# Patient Record
Sex: Male | Born: 1970 | Race: White | Hispanic: No | Marital: Married | Smoking: Current every day smoker
Health system: Southern US, Community
[De-identification: ages and names within clinical notes are randomized; demographics above are authoritative.]

## PROBLEM LIST (undated history)

## (undated) DIAGNOSIS — I10 Essential (primary) hypertension: Secondary | ICD-10-CM

---

## 1998-09-02 ENCOUNTER — Emergency Department (HOSPITAL_COMMUNITY): Admission: EM | Admit: 1998-09-02 | Discharge: 1998-09-02 | Payer: Self-pay | Admitting: Internal Medicine

## 1998-09-02 ENCOUNTER — Encounter: Admission: RE | Admit: 1998-09-02 | Discharge: 1998-09-02 | Payer: Self-pay | Admitting: *Deleted

## 1998-09-03 ENCOUNTER — Encounter: Admission: RE | Admit: 1998-09-03 | Discharge: 1998-12-02 | Payer: Self-pay | Admitting: *Deleted

## 2000-07-11 ENCOUNTER — Encounter: Payer: Self-pay | Admitting: Emergency Medicine

## 2000-07-11 ENCOUNTER — Emergency Department (HOSPITAL_COMMUNITY): Admission: EM | Admit: 2000-07-11 | Discharge: 2000-07-11 | Payer: Self-pay | Admitting: Emergency Medicine

## 2007-05-01 ENCOUNTER — Emergency Department: Payer: Self-pay | Admitting: Emergency Medicine

## 2009-03-05 ENCOUNTER — Ambulatory Visit: Payer: Self-pay | Admitting: Diagnostic Radiology

## 2009-03-05 ENCOUNTER — Ambulatory Visit (HOSPITAL_BASED_OUTPATIENT_CLINIC_OR_DEPARTMENT_OTHER): Admission: RE | Admit: 2009-03-05 | Discharge: 2009-03-05 | Payer: Self-pay | Admitting: Family Medicine

## 2012-09-25 ENCOUNTER — Emergency Department: Payer: Self-pay | Admitting: Emergency Medicine

## 2012-10-05 ENCOUNTER — Emergency Department: Payer: Self-pay | Admitting: Emergency Medicine

## 2020-10-08 ENCOUNTER — Encounter: Payer: Self-pay | Admitting: Intensive Care

## 2020-10-08 ENCOUNTER — Emergency Department: Payer: Self-pay

## 2020-10-08 ENCOUNTER — Emergency Department
Admission: EM | Admit: 2020-10-08 | Discharge: 2020-10-08 | Disposition: A | Discharge status: Home / Self Care | Payer: Self-pay | Attending: Emergency Medicine | Admitting: Emergency Medicine

## 2020-10-08 ENCOUNTER — Other Ambulatory Visit: Payer: Self-pay

## 2020-10-08 DIAGNOSIS — I10 Essential (primary) hypertension: Secondary | ICD-10-CM | POA: Insufficient documentation

## 2020-10-08 DIAGNOSIS — F17210 Nicotine dependence, cigarettes, uncomplicated: Secondary | ICD-10-CM | POA: Insufficient documentation

## 2020-10-08 DIAGNOSIS — R519 Headache, unspecified: Secondary | ICD-10-CM

## 2020-10-08 LAB — BASIC METABOLIC PANEL
Anion gap: 8 (ref 5–15)
BUN: 11 mg/dL (ref 6–20)
CO2: 27 mmol/L (ref 22–32)
Calcium: 9.3 mg/dL (ref 8.9–10.3)
Chloride: 104 mmol/L (ref 98–111)
Creatinine, Ser: 0.95 mg/dL (ref 0.61–1.24)
GFR, Estimated: >60 mL/min (ref >60)
Glucose, Bld: 97 mg/dL (ref 70–99)
Potassium: 4.5 mmol/L (ref 3.5–5.1)
Sodium: 139 mmol/L (ref 135–145)

## 2020-10-08 LAB — TROPONIN I (HIGH SENSITIVITY)
Troponin I (High Sensitivity): 5 ng/L (ref <18)
Troponin I (High Sensitivity): 6 ng/L (ref <18)

## 2020-10-08 LAB — CBC
HCT: 46.9 % (ref 39.0–52.0)
Hemoglobin: 16.1 g/dL (ref 13.0–17.0)
MCH: 31.8 pg (ref 26.0–34.0)
MCHC: 34.3 g/dL (ref 30.0–36.0)
MCV: 92.7 fL (ref 80.0–100.0)
Platelets: 192 10*3/uL (ref 150–400)
RBC: 5.06 MIL/uL (ref 4.22–5.81)
RDW: 11.9 % (ref 11.5–15.5)
WBC: 7.1 10*3/uL (ref 4.0–10.5)
nRBC: 0 % (ref 0.0–0.2)

## 2020-10-08 MED ORDER — AMLODIPINE BESYLATE 5 MG PO TABS: 5.0000 mg | ORAL_TABLET | Freq: Every day | ORAL | 1 refills | Status: DC

## 2020-10-08 MED ORDER — AMLODIPINE BESYLATE 5 MG PO TABS
5.0000 mg | ORAL_TABLET | Freq: Once | ORAL | Status: AC
  Administered 2020-10-08: 5 mg via ORAL
  Filled 2020-10-08: qty 1

## 2020-10-08 NOTE — ED Provider Notes (Signed)
[facility] Emergency Department Provider Note   ____________________________________________   First MD Initiated Contact with Patient 10/08/20 1649     (approximate)  I have reviewed the triage vital signs and the nursing notes.   HISTORY  Chief Complaint Hypertension, Arm Pain, and Headache    HPI Ryan Dillon is a 49 y.o. male with no significant past medical history who presents to the ED complaining of hypertension and headache.  Patient reports he has been dealing with intermittent headaches for the past couple of weeks.  He describes it as a throbbing that typically affects the back of his head.  Pain is constant and not exacerbated or alleviated by anything.  He denies any vision changes, numbness, or weakness.  He checked his blood pressure at home last night and found it to be elevated, decided to be evaluated at urgent care today, where they again found his blood pressure to be high and sent him to the ED.  He denies any previous diagnosis of hypertension.  He does state that he has had occasional pain in his shoulders for the past couple of weeks, but he works as a Development worker, international aid and denies any pain in his chest or difficulty breathing.        History reviewed. No pertinent past medical history.  There are no problems to display for this patient.   History reviewed. No pertinent surgical history.  Prior to Admission medications   Medication Sig Start Date End Date Taking? Authorizing Provider  amLODipine (NORVASC) 5 MG tablet Take 1 tablet (5 mg total) by mouth daily. 10/08/20 12/07/20  Blake Divine, MD    Allergies Patient has no known allergies.  History reviewed. No pertinent family history.  Social History Social History   Tobacco Use  . Smoking status: Current Every Day Smoker    Types: Cigarettes  . Smokeless tobacco: Never Used  Substance Use Topics  . Alcohol use: Never  . Drug use: Never    Review of  Systems  Constitutional: No fever/chills Eyes: No visual changes. ENT: No sore throat. Cardiovascular: Denies chest pain. Respiratory: Denies shortness of breath. Gastrointestinal: No abdominal pain.  No nausea, no vomiting.  No diarrhea.  No constipation. Genitourinary: Negative for dysuria. Musculoskeletal: Negative for back pain.  Positive for shoulder pain. Skin: Negative for rash. Neurological: Positive for headaches, negative for focal weakness or numbness.  ____________________________________________   PHYSICAL EXAM:  VITAL SIGNS: ED Triage Vitals  Enc Vitals Group     BP 10/08/20 1302 (!) 189/92     Pulse Rate 10/08/20 1302 (!) 51     Resp 10/08/20 1302 16     Temp 10/08/20 1302 98.7 F (37.1 C)     Temp Source 10/08/20 1302 Oral     SpO2 10/08/20 1302 99 %     Weight 10/08/20 1303 250 lb (113.4 kg)     Height 10/08/20 1303 5' 11"  (1.803 m)     Head Circumference --      Peak Flow --      Pain Score 10/08/20 1302 3     Pain Loc --      Pain Edu? --      Excl. in [place]? --     Constitutional: Alert and oriented. Eyes: Conjunctivae are normal. Head: Atraumatic. Nose: No congestion/rhinnorhea. Mouth/Throat: Mucous membranes are moist. Neck: Normal ROM Cardiovascular: Normal rate, regular rhythm. Grossly normal heart sounds.  2+ radial pulses bilaterally. Respiratory: Normal respiratory effort.  No retractions. Lungs  CTAB. Gastrointestinal: Soft and nontender. No distention. Genitourinary: deferred Musculoskeletal: No lower extremity tenderness nor edema. Neurologic:  Normal speech and language. No gross focal neurologic deficits are appreciated. Skin:  Skin is warm, dry and intact. No rash noted. Psychiatric: Mood and affect are normal. Speech and behavior are normal.  ____________________________________________   LABS (all labs ordered are listed, but only abnormal results are displayed)  Labs Reviewed  BASIC METABOLIC PANEL  CBC  TROPONIN I (HIGH  SENSITIVITY)  TROPONIN I (HIGH SENSITIVITY)   ____________________________________________  EKG  ED ECG REPORT I, Blake Divine, the attending physician, personally viewed and interpreted this ECG.   Date: 10/08/2020  EKG Time: 13:08  Rate: 48  Rhythm: sinus tachycardia  Axis: Normal  Intervals:none  ST&T Change: none  PROCEDURES  Procedure(s) performed (including Critical Care):  Procedures   ____________________________________________   INITIAL IMPRESSION / ASSESSMENT AND PLAN / ED COURSE       49 year old male with no significant past medical history presents to the ED complaining of intermittent headaches for the past few weeks and elevated blood pressure at urgent care today.  He has occasional shoulder pain but denies any chest pain.  EKG shows no evidence of arrhythmia or ischemia and 2 sets of troponin are negative.  Remainder of lab work is also reassuring and patient has no focal neurologic deficits on exam.  We will further assess with CT head and initiate treatment with amlodipine.  CT head reviewed by me and shows no obvious hemorrhage, negative for acute process per radiology.  Patient's blood pressure gradually improving following dose of amlodipine and he is appropriate for discharge home with PCP follow-up.  He was counseled to return to the ED for new worsening symptoms.  Patient agrees with plan.      ____________________________________________   FINAL CLINICAL IMPRESSION(S) / ED DIAGNOSES  Final diagnoses:  Nonintractable episodic headache, unspecified headache type  Hypertension, unspecified type     ED Discharge Orders         Ordered    amLODipine (NORVASC) 5 MG tablet  Daily        10/08/20 1818           Note:  This document was prepared using Dragon voice recognition software and may include unintentional dictation errors.   Blake Divine, MD 10/08/20 [phone]

## 2020-10-08 NOTE — ED Triage Notes (Signed)
Patient sent by [facility] today for HTN. Reports headache/pressure and left arm pain for several weeks that worsened today.

## 2020-11-13 ENCOUNTER — Ambulatory Visit: Payer: Self-pay | Admitting: Adult Health

## 2021-05-27 LAB — COLOGUARD: COLOGUARD: NEGATIVE

## 2022-06-18 IMAGING — CT CT HEAD W/O CM
3 series · 15 of 47 positions shown, 18 images · non-contrast
Comparison: No pertinent prior exams available for comparison.

CLINICAL DATA: Headache, intracranial hemorrhage suspected.
Additional history provided: Hypertension, headache/pressure, left
arm pain.

EXAM:
CT HEAD WITHOUT CONTRAST
TECHNIQUE: Contiguous axial images were obtained from the base of the skull
through the vertex without intravenous contrast.

[Series 2: head wo · axial · 0.45mm/px · z∈[-141,-16]mm · 9 of 31 slices shown, 12 images]
[im 3/31  brain]
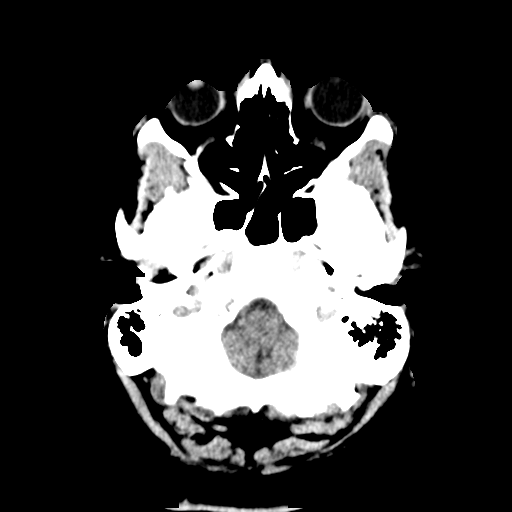
[im 3/31  bone]
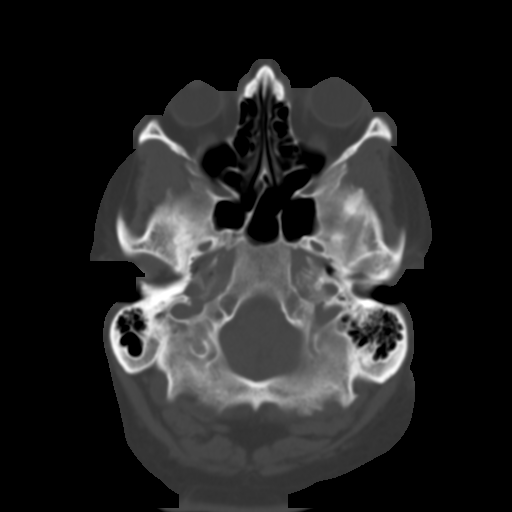
[im 6/31  brain]
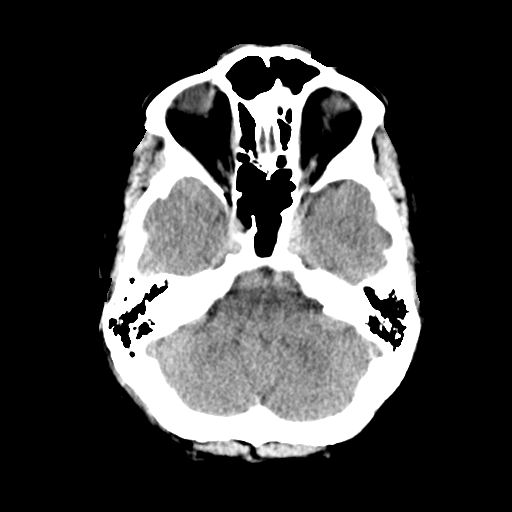
[im 9/31  brain]
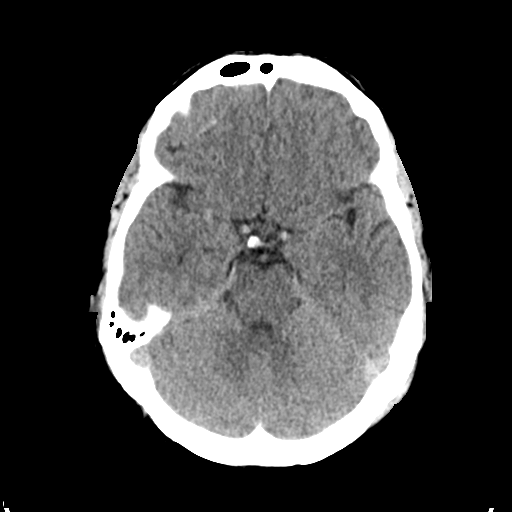
[im 12/31  brain]
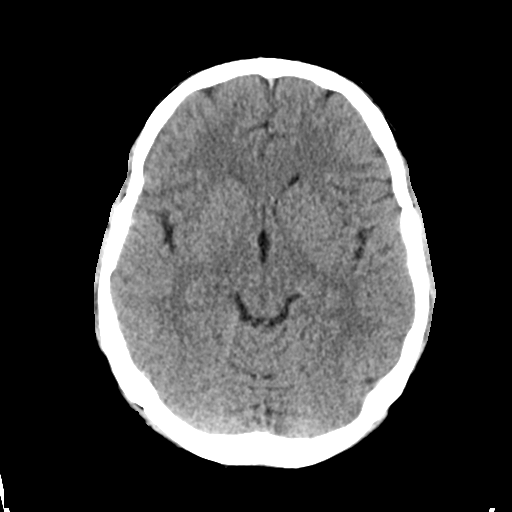
[im 16/31  brain]
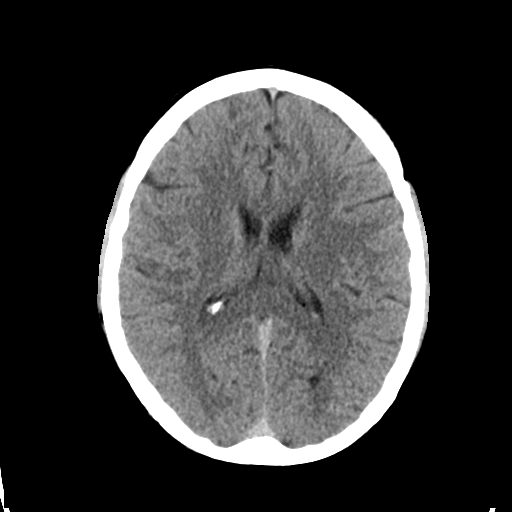
[im 16/31  bone]
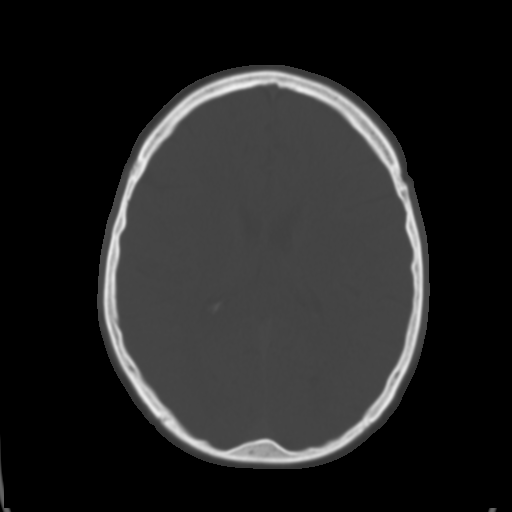
[im 19/31  brain]
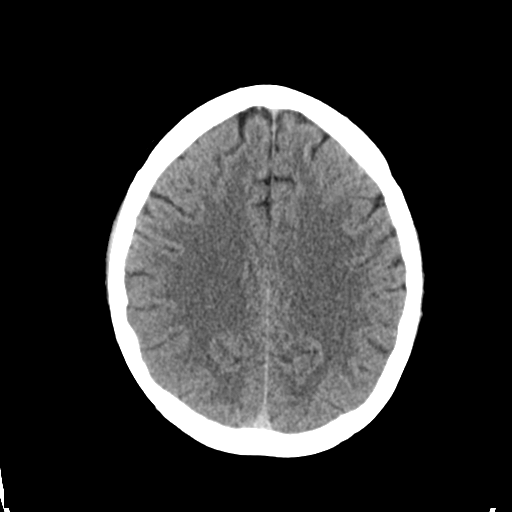
[im 22/31  brain]
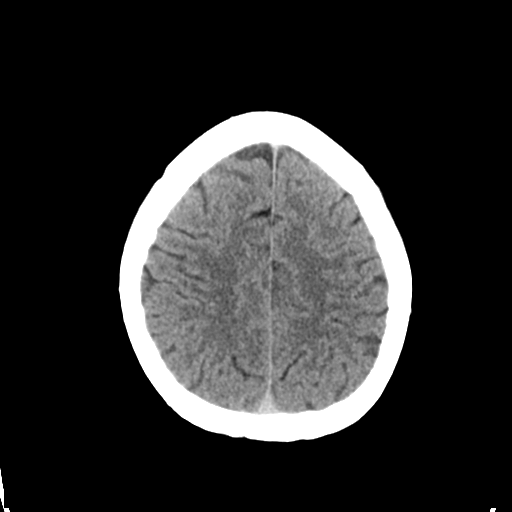
[im 25/31  brain]
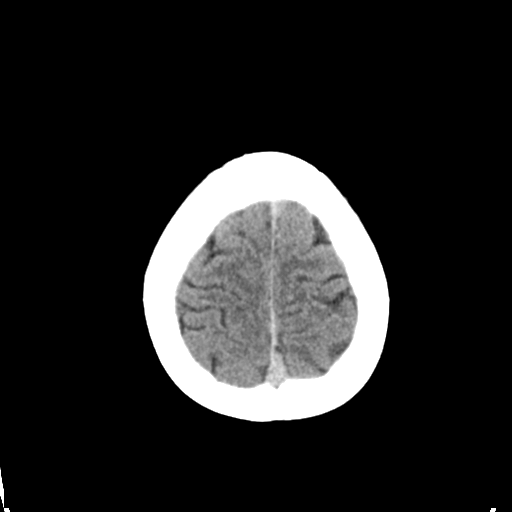
[im 28/31  brain]
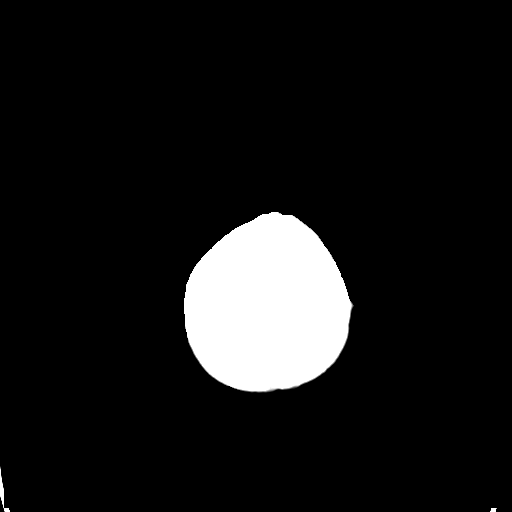
[im 28/31  bone]
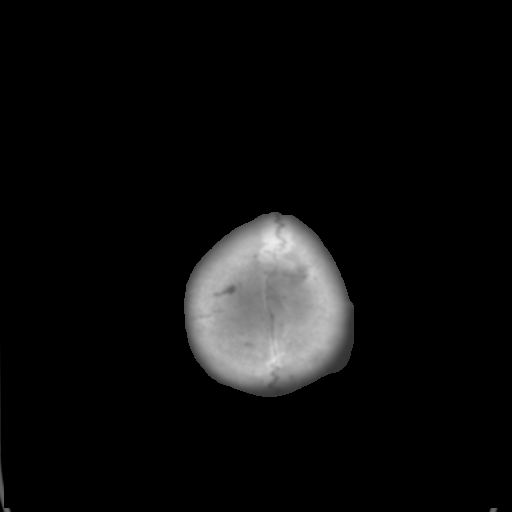

[Series 4: coronal soft tissue · coronal · 0.33mm/px · 3 of 65 slices shown]
[im 22/65  brain]
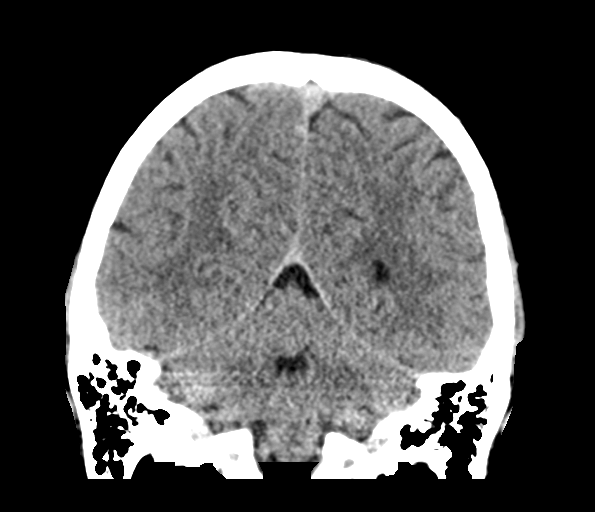
[im 29/65  brain]
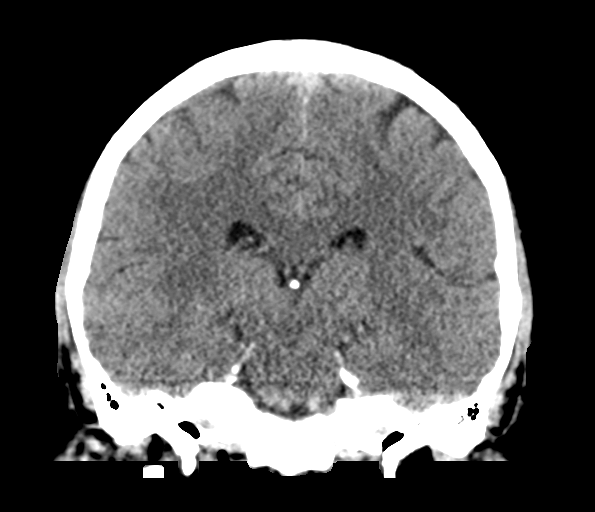
[im 36/65  brain]
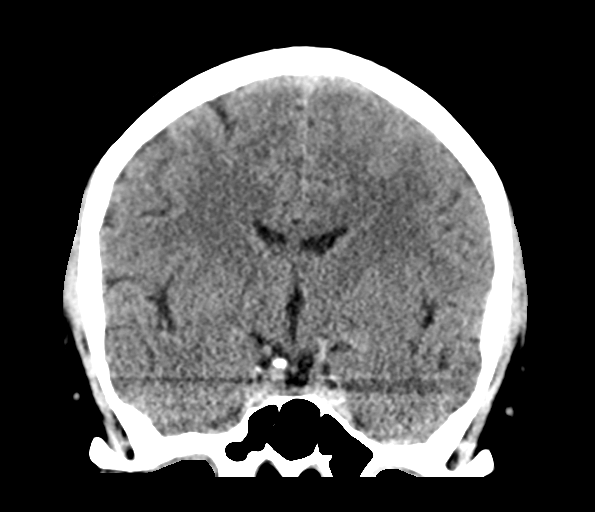

[Series 5: sagittal soft tissue · sagittal · 0.32mm/px · 3 of 54 slices shown]
[im 18/54  brain]
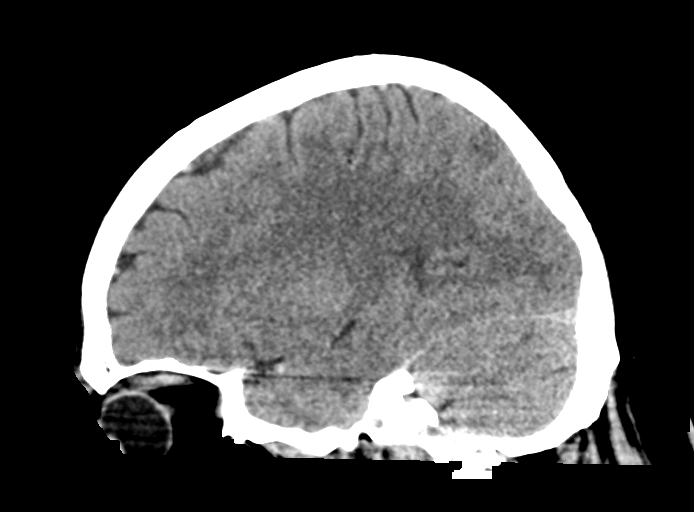
[im 27/54  brain]
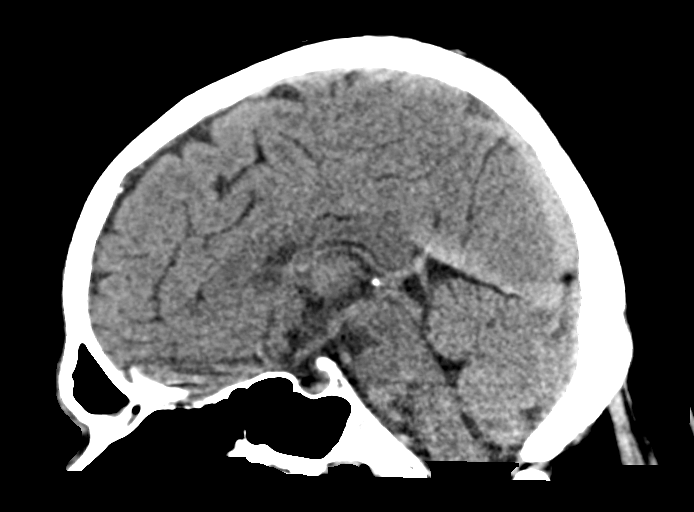
[im 36/54  brain]
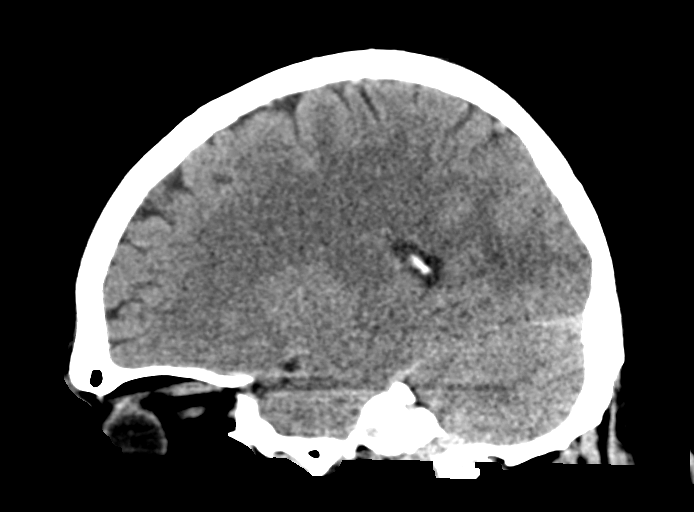

[15 of 47 positions shown; findings below may reference images not displayed]

FINDINGS: Brain:

Cerebral volume is normal for age.

There is no acute intracranial hemorrhage.

No demarcated cortical infarct.

No extra-axial fluid collection.

No evidence of intracranial mass.

No midline shift.

Vascular: No hyperdense vessel.

Skull: Normal. Negative for fracture or focal lesion.

Sinuses/Orbits: Visualized orbits show no acute finding. No
significant paranasal sinus disease at the imaged levels.
IMPRESSION: Unremarkable non-contrast CT appearance of the brain for age. No
evidence of acute intracranial abnormality.

## 2022-06-18 IMAGING — CR DG CHEST 2V
2 series · 2 of 2 positions shown · non-contrast
Comparison: None.

CLINICAL DATA: Short of breath.  Left arm pain

EXAM:
CHEST - 2 VIEW

[chest pa]
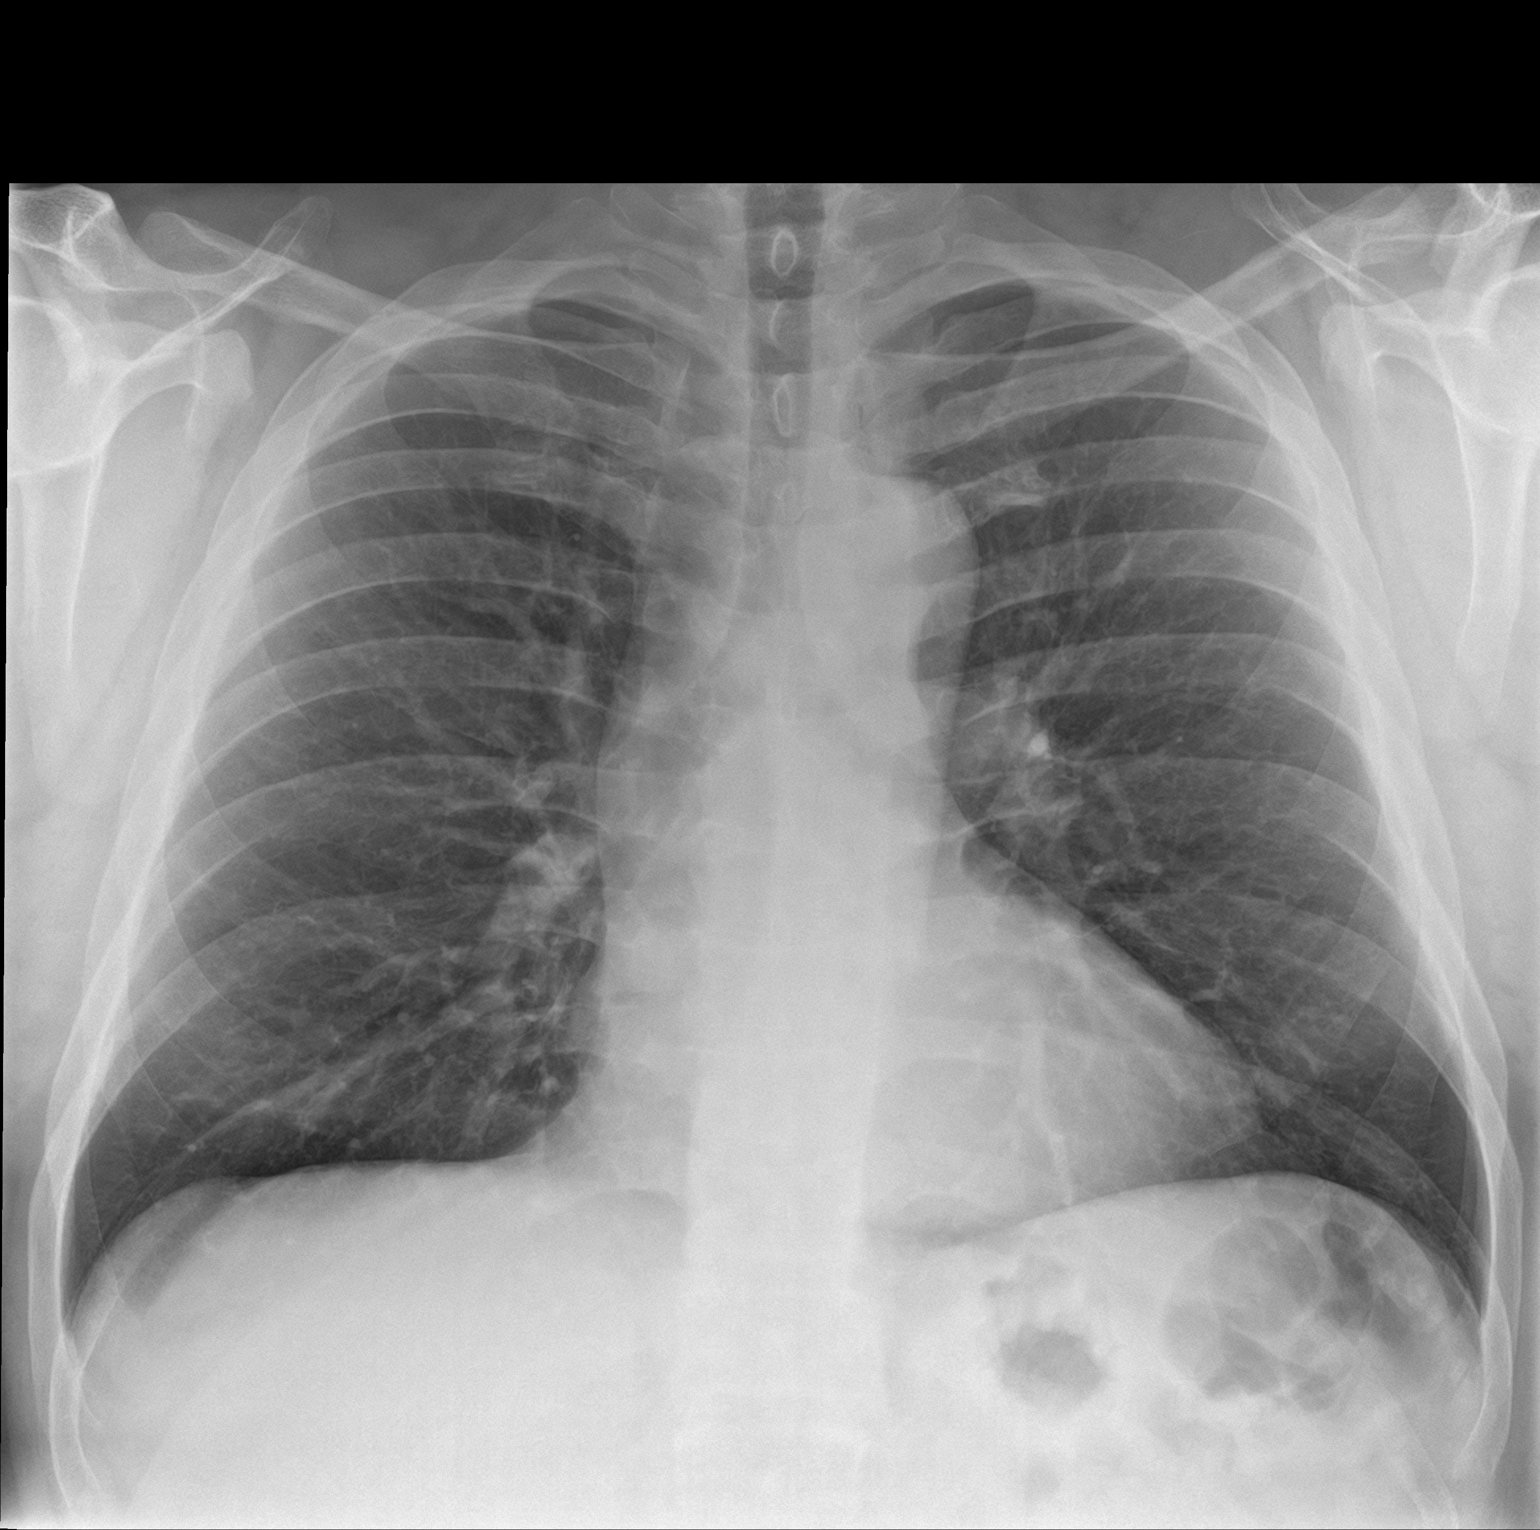

[chest lat]
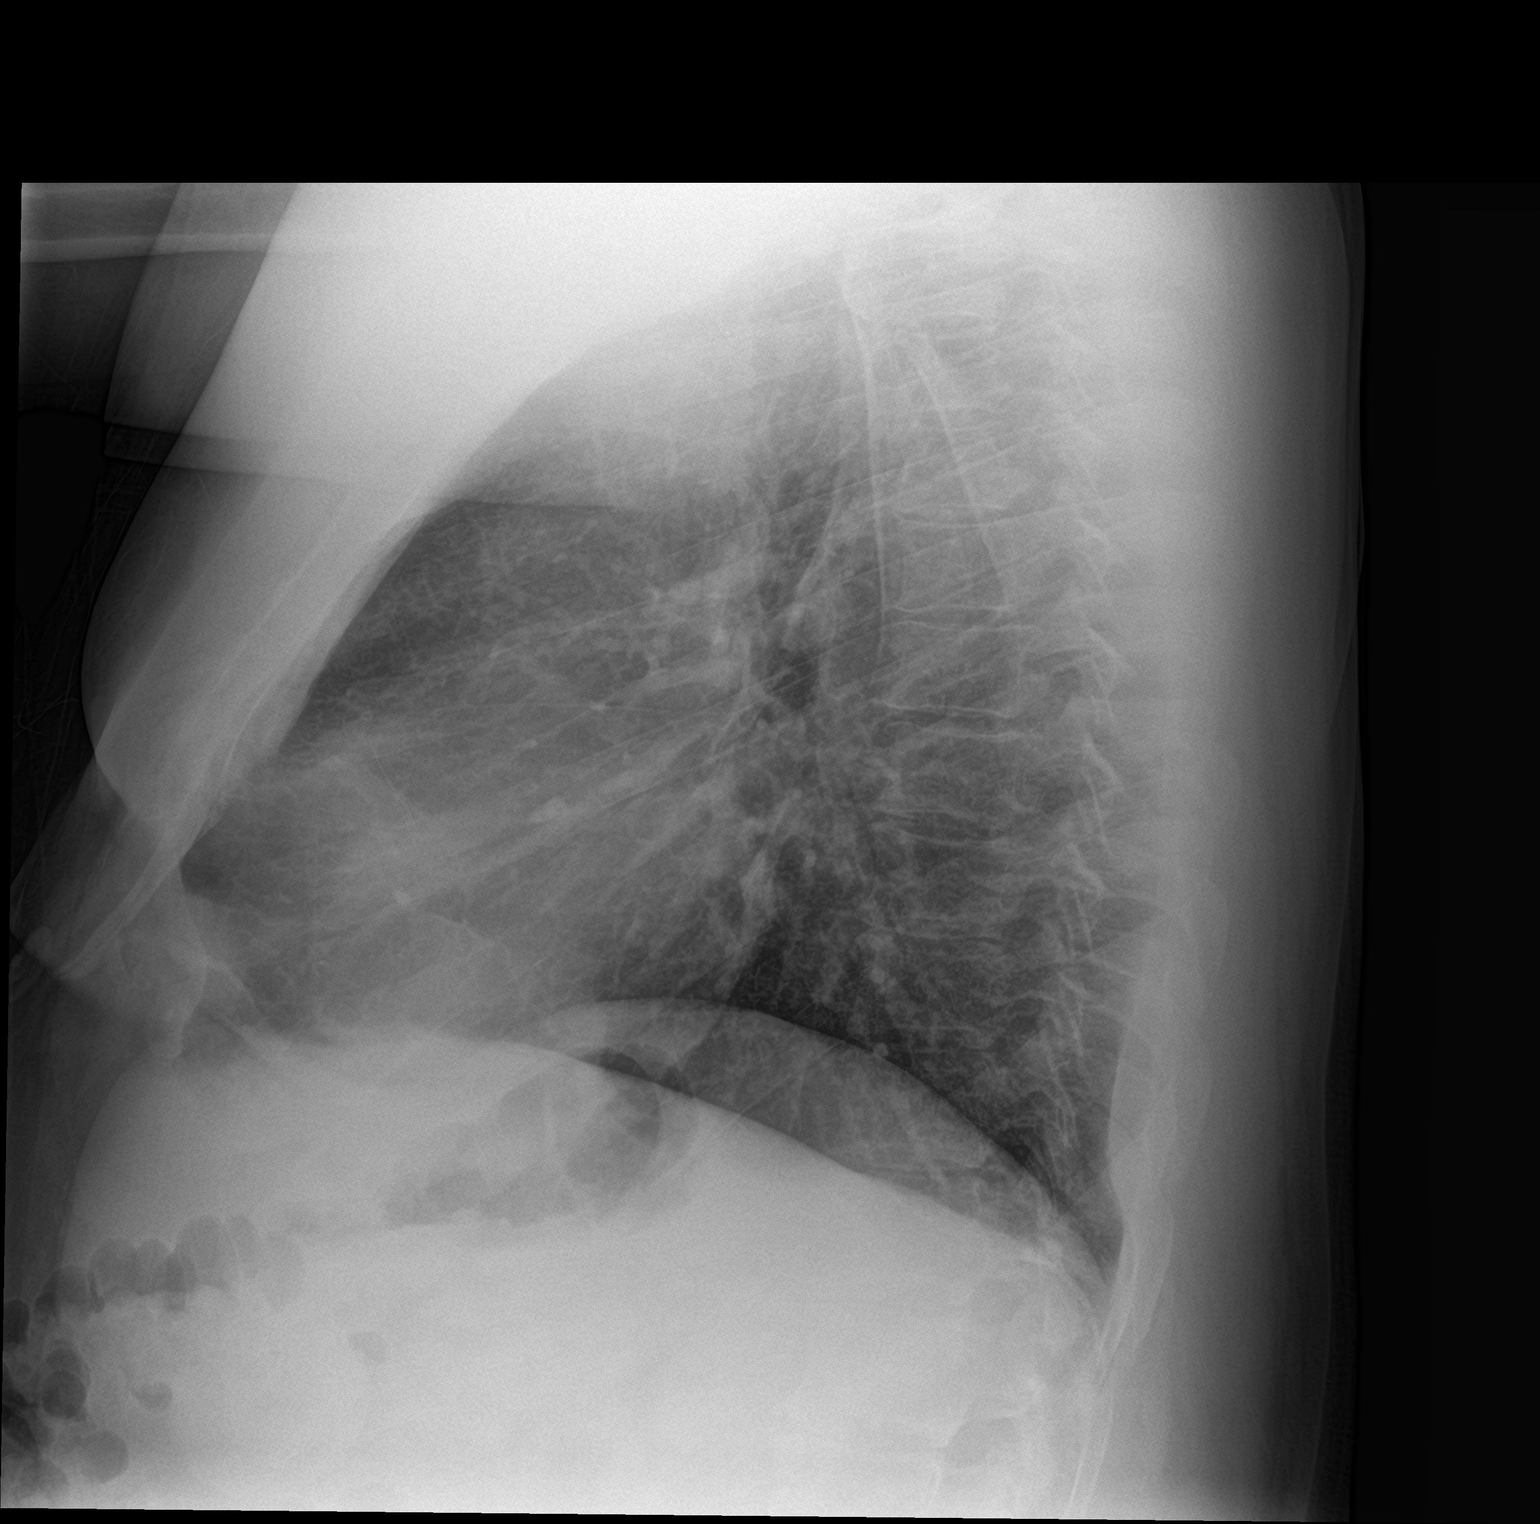

[2 of 2 positions shown; findings below may reference images not displayed]

FINDINGS: The heart size and mediastinal contours are within normal limits.
Both lungs are clear. The visualized skeletal structures are
unremarkable.
IMPRESSION: No active cardiopulmonary disease.

## 2022-07-21 ENCOUNTER — Other Ambulatory Visit: Payer: Self-pay

## 2022-07-21 ENCOUNTER — Encounter (HOSPITAL_COMMUNITY): Payer: Self-pay | Admitting: Emergency Medicine

## 2022-07-21 ENCOUNTER — Emergency Department (HOSPITAL_COMMUNITY)
Admission: EM | Admit: 2022-07-21 | Discharge: 2022-07-21 | Disposition: A | Discharge status: Home / Self Care | Payer: Commercial Managed Care - HMO | Attending: Emergency Medicine | Admitting: Emergency Medicine

## 2022-07-21 ENCOUNTER — Emergency Department (HOSPITAL_COMMUNITY): Payer: Commercial Managed Care - HMO

## 2022-07-21 DIAGNOSIS — Z7982 Long term (current) use of aspirin: Secondary | ICD-10-CM | POA: Diagnosis not present

## 2022-07-21 DIAGNOSIS — Z79899 Other long term (current) drug therapy: Secondary | ICD-10-CM | POA: Diagnosis not present

## 2022-07-21 DIAGNOSIS — Z7951 Long term (current) use of inhaled steroids: Secondary | ICD-10-CM | POA: Diagnosis not present

## 2022-07-21 DIAGNOSIS — I10 Essential (primary) hypertension: Secondary | ICD-10-CM | POA: Insufficient documentation

## 2022-07-21 DIAGNOSIS — Z20822 Contact with and (suspected) exposure to covid-19: Secondary | ICD-10-CM | POA: Diagnosis not present

## 2022-07-21 DIAGNOSIS — R0602 Shortness of breath: Secondary | ICD-10-CM | POA: Diagnosis present

## 2022-07-21 DIAGNOSIS — J449 Chronic obstructive pulmonary disease, unspecified: Secondary | ICD-10-CM | POA: Insufficient documentation

## 2022-07-21 DIAGNOSIS — R059 Cough, unspecified: Secondary | ICD-10-CM | POA: Diagnosis not present

## 2022-07-21 HISTORY — DX: Essential (primary) hypertension: I10

## 2022-07-21 LAB — CBC WITH DIFFERENTIAL/PLATELET
Abs Immature Granulocytes: 0.02 10*3/uL (ref 0.00–0.07)
Basophils Absolute: 0 10*3/uL (ref 0.0–0.1)
Basophils Relative: 0 %
Eosinophils Absolute: 0.1 10*3/uL (ref 0.0–0.5)
Eosinophils Relative: 1 %
HCT: 45.5 % (ref 39.0–52.0)
Hemoglobin: 16 g/dL (ref 13.0–17.0)
Immature Granulocytes: 0 %
Lymphocytes Relative: 28 %
Lymphs Abs: 2.3 10*3/uL (ref 0.7–4.0)
MCH: 32.9 pg (ref 26.0–34.0)
MCHC: 35.2 g/dL (ref 30.0–36.0)
MCV: 93.4 fL (ref 80.0–100.0)
Monocytes Absolute: 0.5 10*3/uL (ref 0.1–1.0)
Monocytes Relative: 6 %
Neutro Abs: 5.3 10*3/uL (ref 1.7–7.7)
Neutrophils Relative %: 65 %
Platelets: 207 10*3/uL (ref 150–400)
RBC: 4.87 MIL/uL (ref 4.22–5.81)
RDW: 12.4 % (ref 11.5–15.5)
WBC: 8.2 10*3/uL (ref 4.0–10.5)
nRBC: 0 % (ref 0.0–0.2)

## 2022-07-21 LAB — BASIC METABOLIC PANEL
Anion gap: 7 (ref 5–15)
BUN: 12 mg/dL (ref 6–20)
CO2: 28 mmol/L (ref 22–32)
Calcium: 9.6 mg/dL (ref 8.9–10.3)
Chloride: 106 mmol/L (ref 98–111)
Creatinine, Ser: 0.97 mg/dL (ref 0.61–1.24)
GFR, Estimated: >60 mL/min (ref >60)
Glucose, Bld: 106 mg/dL — ABNORMAL HIGH (ref 70–99)
Potassium: 4.1 mmol/L (ref 3.5–5.1)
Sodium: 141 mmol/L (ref 135–145)

## 2022-07-21 LAB — BRAIN NATRIURETIC PEPTIDE: B Natriuretic Peptide: 59.5 pg/mL (ref 0.0–100.0)

## 2022-07-21 LAB — D-DIMER, QUANTITATIVE: D-Dimer, Quant: <0.27 ug/mL-FEU (ref 0.00–0.50)

## 2022-07-21 LAB — TROPONIN I (HIGH SENSITIVITY)
Troponin I (High Sensitivity): 5 ng/L (ref <18)
Troponin I (High Sensitivity): 7 ng/L (ref <18)

## 2022-07-21 LAB — SARS CORONAVIRUS 2 BY RT PCR: SARS Coronavirus 2 by RT PCR: NEGATIVE

## 2022-07-21 MED ORDER — METHYLPREDNISOLONE SODIUM SUCC 125 MG IJ SOLR
125.0000 mg | Freq: Once | INTRAMUSCULAR | Status: AC
  Administered 2022-07-21: 125 mg via INTRAVENOUS
  Filled 2022-07-21: qty 2

## 2022-07-21 MED ORDER — PREDNISONE 10 MG (21) PO TBPK: ORAL_TABLET | Freq: Every day | ORAL | 0 refills | Status: AC

## 2022-07-21 MED ORDER — IPRATROPIUM-ALBUTEROL 0.5-2.5 (3) MG/3ML IN SOLN
3.0000 mL | Freq: Once | RESPIRATORY_TRACT | Status: AC
  Administered 2022-07-21: 3 mL via RESPIRATORY_TRACT
  Filled 2022-07-21: qty 3

## 2022-07-21 MED ORDER — ALBUTEROL SULFATE HFA 108 (90 BASE) MCG/ACT IN AERS
4.0000 | INHALATION_SPRAY | Freq: Once | RESPIRATORY_TRACT | Status: AC
  Administered 2022-07-21: 4 via RESPIRATORY_TRACT
  Filled 2022-07-21: qty 6.7

## 2022-07-21 NOTE — ED Notes (Signed)
Pt ambulated from his bed (20) to the charge desk without assistance with O2 sats ranging from 96%-98%. Pt did not report any shortness of breath or discomfort and stated he could even run. Pt was advised not to run.

## 2022-07-21 NOTE — ED Triage Notes (Signed)
Pt. Styated, I started having SOB for a couple of days

## 2022-07-21 NOTE — Discharge Instructions (Addendum)
You are seen in the ER today for your shortness of breath.  Physical exam vital signs blood work are reassuring.  You likely have some inflammation in your lungs from your exposure to your intravascular.  You may also have some element of ongoing COPD.  Please take the prescribed steroid for the entire course.  May use the provided albuterol as needed.  You may follow-up with the pulmonologist listed below and with your primary care doctor.  Return to the ER with any new severe symptoms.

## 2022-07-21 NOTE — ED Provider Notes (Signed)
[facility] EMERGENCY DEPARTMENT Provider Note   CSN: [id number] Arrival date & time: 07/21/22  2633     History  Chief Complaint  Patient presents with   Shortness of Breath    Ryan Dillon is a 51 y.o. male with a past medical history significant for hypertension who presents to the ED due to persistent shortness of breath for the past 4-5 days.  He admits to an intermittent cough.  Denies fever and chills.  No wheeze.  Patient admits to chronic tobacco use (1 pack a day) however, has never been diagnosed with COPD.  Denies lower extremity edema, recent surgeries, recent long immobilizations, or hormonal treatments.  Denies history of blood clots.  No associated chest pain.  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 Denies cardiac history.   History obtained from patient and past medical records. No interpreter used during encounter.       Home Medications Prior to Admission medications   Medication Sig Start Date End Date Taking? Authorizing Provider  albuterol (VENTOLIN HFA) 108 (90 Base) MCG/ACT inhaler Inhale 2 puffs into the lungs every 6 (six) hours as needed for wheezing or shortness of breath.   Yes [provider]  amLODipine (NORVASC) 10 MG tablet Take 10 mg by mouth daily. 07/01/22  Yes [provider]  aspirin EC 81 MG tablet Take 81 mg by mouth once. Swallow whole.   Yes [provider]  atorvastatin (LIPITOR) 20 MG tablet Take 20 mg by mouth at bedtime. 07/10/22  Yes [provider]  chlorthalidone (HYGROTON) 25 MG tablet Take 25 mg by mouth daily. 07/01/22  Yes [provider]  Monte Fantasia INHUB 500-50 MCG/ACT AEPB Inhale 1 puff into the lungs 2 (two) times daily as needed for wheezing or shortness of breath. 07/10/22  Yes [provider]      Allergies    Patient has no known allergies.    Review of Systems   Review of Systems  Constitutional:  Negative for chills and fever.  Respiratory:  Positive for cough and shortness of breath.   Cardiovascular:  Negative for chest pain and leg swelling.  Gastrointestinal:  Negative for abdominal pain, diarrhea, nausea and vomiting.  All other systems reviewed and are negative.   Physical Exam Updated Vital Signs BP (!) 140/67   Pulse (!) 29   Temp 98 F (36.7 C) (Oral)   Resp 12   Ht 5\' 11"  (1.803 m)   Wt 108.9 kg   SpO2 99%   BMI 33.47 kg/m  Physical Exam Vitals and nursing note reviewed.  Constitutional:      General: He is not in acute distress.    Appearance: He is not ill-appearing.  HENT:     Head: Normocephalic.  Eyes:     Pupils: Pupils are equal, round, and reactive to light.  Cardiovascular:     Rate and Rhythm: Normal rate and regular rhythm.     Pulses: Normal pulses.     Heart sounds: Normal heart sounds. No murmur heard.    No friction rub. No gallop.  Pulmonary:     Effort: Pulmonary effort is normal.     Breath sounds: Normal breath sounds.  Abdominal:     General: Abdomen is flat. There is no distension.  Palpations: Abdomen is soft.     Tenderness: There is no abdominal tenderness. There is no guarding or rebound.  Musculoskeletal:        General: Normal range of motion.     Cervical back: Neck supple.     Comments: No lower extremity edema  Skin:    General: Skin is warm and dry.  Neurological:     General: No focal deficit present.     Mental Status: He is alert.  Psychiatric:        Mood and Affect: Mood normal.        Behavior: Behavior normal.     ED Results / Procedures / Treatments   Labs (all labs ordered are listed, but only abnormal results are displayed) Labs Reviewed  BASIC METABOLIC PANEL - Abnormal; Notable for the following components:      Result Value   Glucose, Bld 106 (*)    All other  components within normal limits  SARS CORONAVIRUS 2 BY RT PCR  CBC WITH DIFFERENTIAL/PLATELET  BRAIN NATRIURETIC PEPTIDE  D-DIMER, QUANTITATIVE  TROPONIN I (HIGH SENSITIVITY)    EKG None  Radiology DG Chest 2 View  Result Date: 07/21/2022 CLINICAL DATA:  Shortness of breath and dry cough for 3-4 days. EXAM: CHEST - 2 VIEW COMPARISON:  Chest two views 10/08/2020 FINDINGS: Cardiac silhouette and mediastinal contours are within normal limits. The lungs are clear. No pleural effusion or pneumothorax. No acute skeletal abnormality. IMPRESSION: No active cardiopulmonary disease. Electronically Signed   By: Yvonne Kendall M.D.   On: 07/21/2022 10:33    Procedures Procedures    Medications Ordered in ED Medications  methylPREDNISolone sodium succinate (SOLU-MEDROL) 125 mg/2 mL injection 125 mg (125 mg Intravenous Given 07/21/22 1422)  albuterol (VENTOLIN HFA) 108 (90 Base) MCG/ACT inhaler 4 puff (4 puffs Inhalation Given 07/21/22 1417)    ED Course/ Medical Decision Making/ A&P                           Medical Decision Making Amount and/or Complexity of Data Reviewed Independent Historian: spouse    Details: Wife at bedside provided history Labs: ordered. Decision-making details documented in ED Course. Radiology: ordered and independent interpretation performed. Decision-making details documented in ED Course. ECG/medicine tests: ordered and independent interpretation performed. Decision-making details documented in ED Course.  Risk Prescription drug management.   This patient presents to the ED for concern of SOB, this involves an extensive number of treatment options, and is a complaint that carries with it a high risk of complications and morbidity.  The differential diagnosis includes pneumonia, PE, COPD, ACS, viral etiology, etc   51 year old male presents to the ED due to shortness of breath x4 to 5 days.  Shortness of breath both with exertion and at rest.  Patient states he  feels like he is "suffocating".  No history of COPD however, smokes 1 pack of cigarettes daily.  No history of blood clots.  Upon arrival, patient afebrile, not tachycardic or hypoxic.  Patient in no acute distress.  Reassuring physical exam.  Lungs clear to auscultation bilaterally.  No lower extremity edema.  Routine labs and BNP ordered at triage.  Added D-dimer to rule out PE.  Troponin to rule out atypical ACS.  COVID to rule out infection. Given patient's history of tobacco use, will give dose of Solu-Medrol and albuterol for possible COPD. Possible chemical irritation from new fertilizer?  CBC unremarkable.  No leukocytosis and normal  hemoglobin.  BNP normal.  Doubt CHF.  BMP reassuring.  Normal renal function and no major electrolyte derangements.  Chest x-ray personally reviewed and interpreted which is negative for signs of pneumonia, pneumothorax or widened mediastinum.  2:54 PM reassessed patient at bedside after steroids and albuterol.  Patient midst to mild improvement in shortness of breath.  Awaiting D-dimer, troponin, and COVID results.  Patient handed off to [place], PA-C at shift change pending labs.  If unremarkable, patient may be discharged home with PCP follow-up.        Final Clinical Impression(s) / ED Diagnoses Final diagnoses:  SOB (shortness of breath)    Rx / DC Orders ED Discharge Orders     None         Jesusita Oka 07/21/22 1510    Lorre Nick, MD 07/26/22 2121

## 2022-07-21 NOTE — ED Provider Triage Note (Signed)
Emergency Medicine Provider Triage Evaluation Note  Ryan Dillon , a 51 y.o. male  was evaluated in triage.  Pt complains of sob x 4-5 days even at rest. No exacerbated or alleviating factors. Smoking 1 pack a day. No similar episodes. No cardiax hx. No blood clots in the past.   Review of Systems  Positive: Sob, rash  Negative: Chest pain, leg swelling, fever  Physical Exam  BP (!) 156/74 (BP Location: Right Arm)   Pulse (!) 57   Temp 98.1 F (36.7 C) (Oral)   Resp 18   SpO2 100%  Gen:   Awake, no distress  Resp:  Normal effort MSK:   Moves extremities without difficulty  Other:    Medical Decision Making  Medically screening exam initiated at 10:02 AM.  Appropriate orders placed.  Ryan Dillon was informed that the remainder of the evaluation will be completed by another provider, this initial triage assessment does not replace that evaluation, and the importance of remaining in the ED until their evaluation is complete.     Janeece Fitting, PA-C 07/21/22 1006

## 2022-07-21 NOTE — ED Provider Notes (Signed)
Physical Exam  BP 123/77   Pulse 61   Temp 98.2 F (36.8 C) (Oral)   Resp 14   Ht 5\' 11"  (1.803 m)   Wt 108.9 kg   SpO2 96%   BMI 33.47 kg/m   Physical Exam Vitals and nursing note reviewed.  Constitutional:      Appearance: He is obese. He is not ill-appearing or toxic-appearing.  HENT:     Head: Normocephalic and atraumatic.  Eyes:     General: No scleral icterus.       Right eye: No discharge.        Left eye: No discharge.     Conjunctiva/sclera: Conjunctivae normal.  Pulmonary:     Effort: Pulmonary effort is normal. Prolonged expiration present. No bradypnea, accessory muscle usage, respiratory distress or retractions.     Breath sounds: No decreased breath sounds, wheezing, rhonchi or rales.  Skin:    General: Skin is warm and dry.  Neurological:     General: No focal deficit present.     Mental Status: He is alert.  Psychiatric:        Mood and Affect: Mood normal.     Procedures  Procedures  ED Course / MDM    Medical Decision Making Amount and/or Complexity of Data Reviewed Labs: ordered.    Details: CBC without leuks ptosis or anemia, BMP unremarkable, dimer is normal, BNP is normal, troponin is negative x2 and COVID-19 test is negative.  Radiology:     Details: Chest x-ray visualized by this provider negative for acute cardiopulmonary disease ECG/medicine tests:     Details: EKG with sinus bradycardia with bigeminy, no STEMI.  Risk Prescription drug management.    Care of this patient assumed from preceding ED provider Charmaine Downs, PA-C at time of shift change.  Please see her associated note for further insight into the patient's ED course.  In brief patient is a 51 year old male, chronic smoker with 1-1/2 to 2 packs/day of cigarettes who presents with concern for a few days of chest tightness and shortness of breath.  States that this all started after he began using a new fertilizer which was very malodorous " potent and the fumes".   No  chest pain, palpitations, syncopal episodes.  No history of the same.  Patient does state that one of his primary care doctors told him at some point he has COPD and prescribed Advair discus but he does not take this.  Has been using 1 puff of albuterol twice a day for the last few days and he does endorse some improvement when he uses the albuterol though it is short lasting.   Physical exam with prolonged expiratory phase but otherwise unremarkable pulmonary exam.  Will administer DuoNeb and reevaluate.  Patient was already mentals administered 1 dose of Solu-Medrol prior to my arrival.  Some improvement in chest tightness after DuoNeb.  Pulmonary exam remains reassuring.  Clinical picture most consistent with acute pneumonitis secondary to exposure to fumes from new fertilizer.  Recommend reading the home of the fertilizer, using the prescribed steroids for the entire course and following with pulmonology for likely COPD.  No further report in the ER at this time.  Patient ambulated with maintenance of his oxygen saturation on room air without elevation of heart rate or concerning symptoms for shortness of breath.  Clinical concern for more emergent underlying etiology that warrant further ED work-up or patient management is exceedingly low.  Acy and his wife voiced understanding of her  medical evaluation and treatment plan. Each of their questions answered to their expressed satisfaction.  Return precautions were given.  Patient is well-appearing, stable, and was discharged in good condition.  This chart was dictated using voice recognition software, Dragon. Despite the best efforts of this provider to proofread and correct errors, errors may still occur which can change documentation meaning.        Sherrilee Gilles 07/21/22 1947    Tegeler, Canary Brim, MD 07/21/22 2141

## 2024-08-02 ENCOUNTER — Other Ambulatory Visit: Payer: Self-pay | Admitting: Internal Medicine

## 2024-08-02 DIAGNOSIS — Z122 Encounter for screening for malignant neoplasm of respiratory organs: Secondary | ICD-10-CM

## 2024-09-21 ENCOUNTER — Ambulatory Visit
Admission: RE | Admit: 2024-09-21 | Discharge: 2024-09-21 | Disposition: A | Discharge status: Home / Self Care | Source: Ambulatory Visit | Attending: Internal Medicine | Admitting: Internal Medicine

## 2024-09-21 DIAGNOSIS — Z122 Encounter for screening for malignant neoplasm of respiratory organs: Secondary | ICD-10-CM

## 2024-09-30 LAB — COLOGUARD: COLOGUARD: NEGATIVE
